# Patient Record
Sex: Male | Born: 1985 | Race: White | Hispanic: No | Marital: Married | Smoking: Never smoker
Health system: Southern US, Community
[De-identification: ages and names within clinical notes are randomized; demographics above are authoritative.]

---
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
## 2012-09-13 ENCOUNTER — Emergency Department (HOSPITAL_COMMUNITY)

## 2012-09-13 ENCOUNTER — Emergency Department (HOSPITAL_COMMUNITY)
Admission: EM | Admit: 2012-09-13 | Discharge: 2012-09-13 | Disposition: A | Discharge status: Home / Self Care | Attending: Emergency Medicine | Admitting: Emergency Medicine

## 2012-09-13 ENCOUNTER — Encounter (HOSPITAL_COMMUNITY): Payer: Self-pay | Admitting: *Deleted

## 2012-09-13 DIAGNOSIS — Y929 Unspecified place or not applicable: Secondary | ICD-10-CM | POA: Insufficient documentation

## 2012-09-13 DIAGNOSIS — S0003XA Contusion of scalp, initial encounter: Secondary | ICD-10-CM | POA: Insufficient documentation

## 2012-09-13 DIAGNOSIS — S0093XA Contusion of unspecified part of head, initial encounter: Secondary | ICD-10-CM

## 2012-09-13 DIAGNOSIS — W010XXA Fall on same level from slipping, tripping and stumbling without subsequent striking against object, initial encounter: Secondary | ICD-10-CM | POA: Insufficient documentation

## 2012-09-13 DIAGNOSIS — Y9389 Activity, other specified: Secondary | ICD-10-CM | POA: Insufficient documentation

## 2012-09-13 DIAGNOSIS — W19XXXA Unspecified fall, initial encounter: Secondary | ICD-10-CM

## 2012-09-13 DIAGNOSIS — S0100XA Unspecified open wound of scalp, initial encounter: Secondary | ICD-10-CM | POA: Insufficient documentation

## 2012-09-13 DIAGNOSIS — S0101XA Laceration without foreign body of scalp, initial encounter: Secondary | ICD-10-CM

## 2012-09-13 DIAGNOSIS — S300XXA Contusion of lower back and pelvis, initial encounter: Secondary | ICD-10-CM

## 2012-09-13 MED ORDER — IBUPROFEN 600 MG PO TABS: 600.0000 mg | ORAL_TABLET | Freq: Four times a day (QID) | ORAL | Status: DC | PRN

## 2012-09-13 MED ORDER — BACITRACIN ZINC 500 UNIT/GM EX OINT
TOPICAL_OINTMENT | CUTANEOUS | Status: AC
  Administered 2012-09-13: 1
  Filled 2012-09-13: qty 0.9

## 2012-09-13 MED ORDER — HYDROCODONE-ACETAMINOPHEN 5-325 MG PO TABS: 1.0000 | ORAL_TABLET | ORAL | Status: DC | PRN

## 2012-09-13 NOTE — ED Notes (Signed)
Pt fell off the back of a truck, has abrasion ,with swelling to back of scalp, pain "tail bone" and abrasion to his rt elbow.  ?LOC, No neck pain,  Alert, talking, ambulatory into tx room.

## 2012-09-13 NOTE — ED Provider Notes (Signed)
CSN: [id number]     Arrival date & time 09/13/12  1147 History   First MD Initiated Contact with Patient 09/13/12 1159     Chief Complaint  Patient presents with  . Fall   (Consider location/radiation/quality/duration/timing/severity/associated sxs/prior Treatment) Patient is a 27 y.o. male presenting with fall. The history is provided by the patient.  Fall This is a new problem. The current episode started today. The problem has been gradually improving. Associated symptoms include headaches. Pertinent negatives include no abdominal pain, chest pain, diaphoresis, fever, nausea, neck pain, numbness, visual change or vomiting. Associated symptoms comments: Laceration scalp.   Alvin Jordan is a 27 y.o. male who presents to the ED with low back pain and headache after slipping and falling off the back of a truck. He was removing a tarp when he fell. He hit his lower back at his tail bone and back of his head. Reports headache with lightheadedness. ? LOC. No one witnessed the fall. The history was provided by the patient.  History reviewed. No pertinent past medical history. History reviewed. No pertinent past surgical history. No family history on file. History  Substance Use Topics  . Smoking status: Not on file  . Smokeless tobacco: Current User    Types: Chew  . Alcohol Use: Yes     Comment: occasional    Review of Systems  Constitutional: Negative for fever and diaphoresis.  HENT: Negative for neck pain.   Eyes: Negative for visual disturbance.  Cardiovascular: Negative for chest pain.  Gastrointestinal: Negative for nausea, vomiting and abdominal pain.  Musculoskeletal: Positive for back pain.  Skin: Positive for wound.  Neurological: Positive for light-headedness and headaches. Negative for numbness.  Psychiatric/Behavioral: Negative for confusion. The patient is not nervous/anxious.     Allergies  Review of patient's allergies indicates no known allergies.  Home Medications   No current outpatient prescriptions on file. BP 126/84  Pulse 83  Temp(Src) 98.1 F (36.7 C) (Oral)  Resp 20  Ht 5\' 3"  (1.6 m)  Wt 173 lb (78.472 kg)  BMI 30.65 kg/m2  SpO2 98% Physical Exam  Nursing note and vitals reviewed. Constitutional: He is oriented to person, place, and time. He appears well-developed and well-nourished. No distress.  HENT:  Head:    Right Ear: Tympanic membrane normal.  Left Ear: Tympanic membrane normal.  Nose: Nose normal.  Mouth/Throat: Uvula is midline.  Superficial scalp laceration.  Eyes: Conjunctivae and EOM are normal. Pupils are equal, round, and reactive to light.  Neck: Normal range of motion. Neck supple.  Cardiovascular: Normal rate and regular rhythm.   Pulmonary/Chest: Effort normal and breath sounds normal.  Abdominal: Soft. Bowel sounds are normal. There is no tenderness.  Musculoskeletal: Normal range of motion.       Back:  Tenderness with palpation coccyx area  Neurological: He is alert and oriented to person, place, and time. No cranial nerve deficit.  Skin: Skin is warm and dry.  Psychiatric: He has a normal mood and affect. His behavior is normal. Judgment and thought content normal.     Dg Sacrum/coccyx  09/13/2012   CLINICAL DATA:  Fall, pain.  EXAM: SACRUM AND COCCYX - 2+ VIEW  COMPARISON:  None.  FINDINGS: There is no evidence of fracture or other focal bone lesions  IMPRESSION: Negative.   Electronically Signed   By: Charlett Nose   On: 09/13/2012 13:26   Ct Head Wo Contrast  09/13/2012   CLINICAL DATA:  Fall, hit head.  EXAM: CT HEAD WITHOUT CONTRAST  TECHNIQUE: Contiguous axial images were obtained from the base of the skull through the vertex without intravenous contrast.  COMPARISON:  None.  FINDINGS: No acute intracranial abnormality. Specifically, no hemorrhage, hydrocephalus, mass lesion, acute infarction, or significant intracranial injury. No acute calvarial abnormality.  Slight mucosal thickening in the  paranasal sinuses. No air-fluid levels.  IMPRESSION: No acute intracranial abnormality.   Electronically Signed   By: Charlett Nose   On: 09/13/2012 13:43    ED Course  Procedures  MDM  27 y.o. male with contusion and laceration of posterior scalp area s/p fall from a truck. Contusion to coccyx area.  I have reviewed this patient's vital signs, nurses notes,  and imaging.  I have discussed findings with the patient and his coworker and plan of care. They voice understanding. Instructions given on head injury observation.    Medication List         HYDROcodone-acetaminophen 5-325 MG per tablet  Commonly known as:  NORCO/VICODIN  Take 1 tablet by mouth every 4 (four) hours as needed.     ibuprofen 600 MG tablet  Commonly known as:  ADVIL,MOTRIN  Take 1 tablet (600 mg total) by mouth every 6 (six) hours as needed for pain.           [facility] Orlene Och, [place] 09/13/12 [phone]

## 2012-09-13 NOTE — ED Notes (Signed)
Abrasion to post scalp cleansed  And bacitracin applied.

## 2012-09-13 NOTE — ED Notes (Signed)
Climbing off back of truck, slipped, landing on back.  Reports hit head, but denies LOC.  Reports headache with lightheadedness, and sacral pain.

## 2012-09-15 NOTE — ED Provider Notes (Signed)
Medical screening examination/treatment/procedure(s) were performed by non-physician practitioner and as supervising physician I was immediately available for consultation/collaboration.  Donnetta Hutching, MD 09/15/12 1623

## 2013-11-16 ENCOUNTER — Observation Stay (HOSPITAL_COMMUNITY): Admitting: Certified Registered Nurse Anesthetist

## 2013-11-16 ENCOUNTER — Encounter (HOSPITAL_COMMUNITY): Payer: Self-pay | Admitting: Emergency Medicine

## 2013-11-16 ENCOUNTER — Emergency Department (HOSPITAL_COMMUNITY)

## 2013-11-16 ENCOUNTER — Encounter (HOSPITAL_COMMUNITY)
Admission: EM | Disposition: A | Discharge status: Home / Self Care | Payer: Self-pay | Source: Home / Self Care | Attending: Emergency Medicine

## 2013-11-16 ENCOUNTER — Observation Stay (HOSPITAL_COMMUNITY)
Admission: EM | Admit: 2013-11-16 | Discharge: 2013-11-17 | Disposition: A | Discharge status: Home / Self Care | Attending: Surgery | Admitting: Surgery

## 2013-11-16 ENCOUNTER — Encounter (HOSPITAL_COMMUNITY): Admitting: Certified Registered Nurse Anesthetist

## 2013-11-16 DIAGNOSIS — K353 Acute appendicitis with localized peritonitis, without perforation or gangrene: Secondary | ICD-10-CM

## 2013-11-16 DIAGNOSIS — Z720 Tobacco use: Secondary | ICD-10-CM | POA: Diagnosis not present

## 2013-11-16 DIAGNOSIS — Z6832 Body mass index (BMI) 32.0-32.9, adult: Secondary | ICD-10-CM | POA: Diagnosis not present

## 2013-11-16 DIAGNOSIS — R1031 Right lower quadrant pain: Secondary | ICD-10-CM

## 2013-11-16 DIAGNOSIS — K3580 Unspecified acute appendicitis: Principal | ICD-10-CM | POA: Insufficient documentation

## 2013-11-16 HISTORY — PX: LAPAROSCOPIC APPENDECTOMY: SHX408

## 2013-11-16 LAB — CBC WITH DIFFERENTIAL/PLATELET
Basophils Absolute: 0 10*3/uL (ref 0.0–0.1)
Basophils Relative: 0 % (ref 0–1)
Eosinophils Absolute: 0 10*3/uL (ref 0.0–0.7)
Eosinophils Relative: 0 % (ref 0–5)
HEMATOCRIT: 45.3 % (ref 39.0–52.0)
HEMOGLOBIN: 16.1 g/dL (ref 13.0–17.0)
LYMPHS ABS: 2.2 10*3/uL (ref 0.7–4.0)
LYMPHS PCT: 11 % — AB (ref 12–46)
MCH: 32.7 pg (ref 26.0–34.0)
MCHC: 35.5 g/dL (ref 30.0–36.0)
MCV: 92.1 fL (ref 78.0–100.0)
MONO ABS: 1.4 10*3/uL — AB (ref 0.1–1.0)
MONOS PCT: 7 % (ref 3–12)
NEUTROS ABS: 16.4 10*3/uL — AB (ref 1.7–7.7)
NEUTROS PCT: 82 % — AB (ref 43–77)
Platelets: 213 10*3/uL (ref 150–400)
RBC: 4.92 MIL/uL (ref 4.22–5.81)
RDW: 12.3 % (ref 11.5–15.5)
WBC: 20 10*3/uL — AB (ref 4.0–10.5)

## 2013-11-16 LAB — URINALYSIS, ROUTINE W REFLEX MICROSCOPIC
BILIRUBIN URINE: NEGATIVE
GLUCOSE, UA: NEGATIVE mg/dL
Hgb urine dipstick: NEGATIVE
KETONES UR: NEGATIVE mg/dL
Leukocytes, UA: NEGATIVE
Nitrite: NEGATIVE
Protein, ur: NEGATIVE mg/dL
Specific Gravity, Urine: 1.004 — ABNORMAL LOW (ref 1.005–1.030)
UROBILINOGEN UA: 0.2 mg/dL (ref 0.0–1.0)
pH: 6.5 (ref 5.0–8.0)

## 2013-11-16 LAB — COMPREHENSIVE METABOLIC PANEL
ALT: 22 U/L (ref 0–53)
ANION GAP: 13 (ref 5–15)
AST: 20 U/L (ref 0–37)
Albumin: 4.4 g/dL (ref 3.5–5.2)
Alkaline Phosphatase: 71 U/L (ref 39–117)
BILIRUBIN TOTAL: 2.2 mg/dL — AB (ref 0.3–1.2)
BUN: 8 mg/dL (ref 6–23)
CO2: 26 meq/L (ref 19–32)
CREATININE: 0.94 mg/dL (ref 0.50–1.35)
Calcium: 9.8 mg/dL (ref 8.4–10.5)
Chloride: 101 mEq/L (ref 96–112)
GFR calc Af Amer: >90 mL/min (ref >90)
Glucose, Bld: 98 mg/dL (ref 70–99)
Potassium: 3.8 mEq/L (ref 3.7–5.3)
Sodium: 140 mEq/L (ref 137–147)
Total Protein: 8 g/dL (ref 6.0–8.3)

## 2013-11-16 LAB — LIPASE, BLOOD: Lipase: 15 U/L (ref 11–59)

## 2013-11-16 LAB — SURGICAL PCR SCREEN
MRSA, PCR: NEGATIVE
Staphylococcus aureus: NEGATIVE

## 2013-11-16 SURGERY — APPENDECTOMY, LAPAROSCOPIC
Anesthesia: General | Site: Abdomen

## 2013-11-16 MED ORDER — MIDAZOLAM HCL 2 MG/2ML IJ SOLN: 0.5000 mg | Freq: Once | INTRAMUSCULAR | Status: AC | PRN

## 2013-11-16 MED ORDER — ONDANSETRON HCL 4 MG/2ML IJ SOLN
INTRAMUSCULAR | Status: AC
  Filled 2013-11-16: qty 2

## 2013-11-16 MED ORDER — OXYCODONE HCL 5 MG/5ML PO SOLN
5.0000 mg | Freq: Once | ORAL | Status: AC | PRN
Start: 2013-11-16 — End: 2013-11-16

## 2013-11-16 MED ORDER — 0.9 % SODIUM CHLORIDE (POUR BTL) OPTIME
TOPICAL | Status: DC | PRN
  Administered 2013-11-16: 1000 mL

## 2013-11-16 MED ORDER — KCL IN DEXTROSE-NACL 20-5-0.45 MEQ/L-%-% IV SOLN
INTRAVENOUS | Status: DC
  Administered 2013-11-16 – 2013-11-17 (×2): via INTRAVENOUS
  Filled 2013-11-16 (×5): qty 1000

## 2013-11-16 MED ORDER — DIPHENHYDRAMINE HCL 50 MG/ML IJ SOLN: 12.5000 mg | Freq: Four times a day (QID) | INTRAMUSCULAR | Status: DC | PRN

## 2013-11-16 MED ORDER — BUPIVACAINE-EPINEPHRINE 0.25% -1:200000 IJ SOLN
INTRAMUSCULAR | Status: DC | PRN
  Administered 2013-11-16: 8 mL

## 2013-11-16 MED ORDER — LIDOCAINE HCL (CARDIAC) 20 MG/ML IV SOLN
INTRAVENOUS | Status: AC
  Filled 2013-11-16: qty 5

## 2013-11-16 MED ORDER — IOHEXOL 300 MG/ML  SOLN
25.0000 mL | INTRAMUSCULAR | Status: AC
  Administered 2013-11-16: 25 mL via ORAL

## 2013-11-16 MED ORDER — NEOSTIGMINE METHYLSULFATE 10 MG/10ML IV SOLN
INTRAVENOUS | Status: AC
  Filled 2013-11-16: qty 2

## 2013-11-16 MED ORDER — SODIUM CHLORIDE 0.9 % IV BOLUS (SEPSIS)
1000.0000 mL | Freq: Once | INTRAVENOUS | Status: AC
Start: 2013-11-16 — End: 2013-11-16
  Administered 2013-11-16: 1000 mL via INTRAVENOUS

## 2013-11-16 MED ORDER — LACTATED RINGERS IV SOLN
INTRAVENOUS | Status: DC | PRN
  Administered 2013-11-16: 19:00:00 via INTRAVENOUS

## 2013-11-16 MED ORDER — OXYCODONE-ACETAMINOPHEN 5-325 MG PO TABS
1.0000 | ORAL_TABLET | ORAL | Status: DC | PRN
  Administered 2013-11-17: 1 via ORAL
  Filled 2013-11-16 (×2): qty 1

## 2013-11-16 MED ORDER — ONDANSETRON HCL 4 MG/2ML IJ SOLN
4.0000 mg | Freq: Once | INTRAMUSCULAR | Status: AC
  Administered 2013-11-16: 4 mg via INTRAVENOUS
  Filled 2013-11-16: qty 2

## 2013-11-16 MED ORDER — METRONIDAZOLE IN NACL 5-0.79 MG/ML-% IV SOLN
500.0000 mg | Freq: Three times a day (TID) | INTRAVENOUS | Status: DC
  Administered 2013-11-16 – 2013-11-17 (×3): 500 mg via INTRAVENOUS
  Filled 2013-11-16 (×4): qty 100

## 2013-11-16 MED ORDER — GLYCOPYRROLATE 0.2 MG/ML IJ SOLN
INTRAMUSCULAR | Status: AC
  Filled 2013-11-16: qty 3

## 2013-11-16 MED ORDER — DEXTROSE 5 % IV SOLN
2.0000 g | INTRAVENOUS | Status: DC
  Administered 2013-11-16: 2 g via INTRAVENOUS
  Filled 2013-11-16 (×2): qty 2

## 2013-11-16 MED ORDER — MEPERIDINE HCL 25 MG/ML IJ SOLN
6.2500 mg | INTRAMUSCULAR | Status: DC | PRN
  Filled 2013-11-16: qty 1

## 2013-11-16 MED ORDER — DIPHENHYDRAMINE HCL 50 MG/ML IJ SOLN
INTRAMUSCULAR | Status: AC
  Filled 2013-11-16: qty 1

## 2013-11-16 MED ORDER — SUCCINYLCHOLINE CHLORIDE 20 MG/ML IJ SOLN
INTRAMUSCULAR | Status: AC
  Filled 2013-11-16: qty 1

## 2013-11-16 MED ORDER — FENTANYL CITRATE 0.05 MG/ML IJ SOLN
INTRAMUSCULAR | Status: AC
  Filled 2013-11-16: qty 5

## 2013-11-16 MED ORDER — SODIUM CHLORIDE 0.9 % IR SOLN
Status: DC | PRN
  Administered 2013-11-16: 1000 mL

## 2013-11-16 MED ORDER — ONDANSETRON HCL 4 MG/2ML IJ SOLN
INTRAMUSCULAR | Status: DC | PRN
  Administered 2013-11-16: 4 mg via INTRAVENOUS

## 2013-11-16 MED ORDER — ACETAMINOPHEN 325 MG PO TABS: 650.0000 mg | ORAL_TABLET | Freq: Four times a day (QID) | ORAL | Status: DC | PRN

## 2013-11-16 MED ORDER — HYDROMORPHONE HCL 1 MG/ML IJ SOLN
0.2500 mg | INTRAMUSCULAR | Status: DC | PRN
  Administered 2013-11-16: 0.5 mg via INTRAVENOUS

## 2013-11-16 MED ORDER — GLYCOPYRROLATE 0.2 MG/ML IJ SOLN
INTRAMUSCULAR | Status: DC | PRN
  Administered 2013-11-16: 0.6 mg via INTRAVENOUS

## 2013-11-16 MED ORDER — DEXAMETHASONE SODIUM PHOSPHATE 4 MG/ML IJ SOLN
INTRAMUSCULAR | Status: DC | PRN
  Administered 2013-11-16: 8 mg via INTRAVENOUS

## 2013-11-16 MED ORDER — SUCCINYLCHOLINE CHLORIDE 20 MG/ML IJ SOLN
INTRAMUSCULAR | Status: DC | PRN
  Administered 2013-11-16: 120 mg via INTRAVENOUS

## 2013-11-16 MED ORDER — ROCURONIUM BROMIDE 100 MG/10ML IV SOLN
INTRAVENOUS | Status: DC | PRN
  Administered 2013-11-16: 25 mg via INTRAVENOUS

## 2013-11-16 MED ORDER — FENTANYL CITRATE 0.05 MG/ML IJ SOLN
INTRAMUSCULAR | Status: DC | PRN
  Administered 2013-11-16 (×5): 50 ug via INTRAVENOUS

## 2013-11-16 MED ORDER — MIDAZOLAM HCL 5 MG/5ML IJ SOLN
INTRAMUSCULAR | Status: DC | PRN
  Administered 2013-11-16: 2 mg via INTRAVENOUS

## 2013-11-16 MED ORDER — DIPHENHYDRAMINE HCL 50 MG/ML IJ SOLN: 10.0000 mg | Freq: Once | INTRAMUSCULAR | Status: DC

## 2013-11-16 MED ORDER — HYDROMORPHONE HCL 1 MG/ML IJ SOLN
INTRAMUSCULAR | Status: AC
  Filled 2013-11-16: qty 1

## 2013-11-16 MED ORDER — OXYCODONE HCL 5 MG PO TABS: 5.0000 mg | ORAL_TABLET | Freq: Once | ORAL | Status: AC | PRN

## 2013-11-16 MED ORDER — ACETAMINOPHEN 650 MG RE SUPP: 650.0000 mg | Freq: Four times a day (QID) | RECTAL | Status: DC | PRN

## 2013-11-16 MED ORDER — KCL IN DEXTROSE-NACL 20-5-0.45 MEQ/L-%-% IV SOLN
INTRAVENOUS | Status: AC
  Filled 2013-11-16: qty 1000

## 2013-11-16 MED ORDER — DIPHENHYDRAMINE HCL 50 MG/ML IJ SOLN
INTRAMUSCULAR | Status: DC | PRN
  Administered 2013-11-16: 12.5 mg via INTRAVENOUS

## 2013-11-16 MED ORDER — MORPHINE SULFATE 2 MG/ML IJ SOLN
1.0000 mg | INTRAMUSCULAR | Status: DC | PRN
  Administered 2013-11-17 (×2): 2 mg via INTRAVENOUS
  Filled 2013-11-16 (×2): qty 1

## 2013-11-16 MED ORDER — ONDANSETRON HCL 4 MG/2ML IJ SOLN: 4.0000 mg | Freq: Four times a day (QID) | INTRAMUSCULAR | Status: DC | PRN

## 2013-11-16 MED ORDER — PROMETHAZINE HCL 25 MG/ML IJ SOLN: 6.2500 mg | INTRAMUSCULAR | Status: DC | PRN

## 2013-11-16 MED ORDER — PROPOFOL 10 MG/ML IV BOLUS
INTRAVENOUS | Status: DC | PRN
  Administered 2013-11-16: 200 mg via INTRAVENOUS

## 2013-11-16 MED ORDER — BUPIVACAINE-EPINEPHRINE (PF) 0.25% -1:200000 IJ SOLN
INTRAMUSCULAR | Status: AC
  Filled 2013-11-16: qty 30

## 2013-11-16 MED ORDER — DIPHENHYDRAMINE HCL 12.5 MG/5ML PO ELIX
12.5000 mg | ORAL_SOLUTION | Freq: Four times a day (QID) | ORAL | Status: DC | PRN
Start: 2013-11-16 — End: 2013-11-17

## 2013-11-16 MED ORDER — MORPHINE SULFATE 4 MG/ML IJ SOLN
4.0000 mg | Freq: Once | INTRAMUSCULAR | Status: AC
  Administered 2013-11-16: 4 mg via INTRAVENOUS
  Filled 2013-11-16: qty 1

## 2013-11-16 MED ORDER — NEOSTIGMINE METHYLSULFATE 10 MG/10ML IV SOLN
INTRAVENOUS | Status: DC | PRN
  Administered 2013-11-16: 5 mg via INTRAVENOUS

## 2013-11-16 MED ORDER — KETOROLAC TROMETHAMINE 30 MG/ML IJ SOLN
INTRAMUSCULAR | Status: AC
  Filled 2013-11-16: qty 1

## 2013-11-16 MED ORDER — MIDAZOLAM HCL 2 MG/2ML IJ SOLN
INTRAMUSCULAR | Status: AC
  Filled 2013-11-16: qty 2

## 2013-11-16 MED ORDER — OXYCODONE HCL 5 MG PO TABS
ORAL_TABLET | ORAL | Status: AC
  Filled 2013-11-16: qty 1

## 2013-11-16 MED ORDER — IOHEXOL 300 MG/ML  SOLN
80.0000 mL | Freq: Once | INTRAMUSCULAR | Status: AC | PRN
  Administered 2013-11-16: 80 mL via INTRAVENOUS

## 2013-11-16 MED ORDER — ROCURONIUM BROMIDE 50 MG/5ML IV SOLN
INTRAVENOUS | Status: AC
  Filled 2013-11-16: qty 2

## 2013-11-16 MED ORDER — PROPOFOL 10 MG/ML IV BOLUS
INTRAVENOUS | Status: AC
  Filled 2013-11-16: qty 20

## 2013-11-16 MED ORDER — LACTATED RINGERS IV SOLN
INTRAVENOUS | Status: DC
  Administered 2013-11-16: 18:00:00 via INTRAVENOUS

## 2013-11-16 MED ORDER — KETOROLAC TROMETHAMINE 30 MG/ML IJ SOLN
30.0000 mg | Freq: Four times a day (QID) | INTRAMUSCULAR | Status: DC
  Administered 2013-11-16 – 2013-11-17 (×3): 30 mg via INTRAVENOUS
  Filled 2013-11-16 (×6): qty 1

## 2013-11-16 SURGICAL SUPPLY — 53 items
ADH SKN CLS LQ APL DERMABOND (GAUZE/BANDAGES/DRESSINGS) ×1
APL SKNCLS STERI-STRIP NONHPOA (GAUZE/BANDAGES/DRESSINGS) ×1
APPLIER CLIP ROT 10 11.4 M/L (STAPLE) ×3
APR CLP MED LRG 11.4X10 (STAPLE) ×1
BAG SPEC RTRVL LRG 6X4 10 (ENDOMECHANICALS) ×1
BENZOIN TINCTURE PRP APPL 2/3 (GAUZE/BANDAGES/DRESSINGS) ×3 IMPLANT
BLADE SURG ROTATE 9660 (MISCELLANEOUS) IMPLANT
CANISTER SUCTION 2500CC (MISCELLANEOUS) ×3 IMPLANT
CHLORAPREP W/TINT 26ML (MISCELLANEOUS) ×3 IMPLANT
CLIP APPLIE ROT 10 11.4 M/L (STAPLE) IMPLANT
CLOSURE STERI-STRIP 1/2X4 (GAUZE/BANDAGES/DRESSINGS) ×1
CLSR STERI-STRIP ANTIMIC 1/2X4 (GAUZE/BANDAGES/DRESSINGS) ×1 IMPLANT
COVER SURGICAL LIGHT HANDLE (MISCELLANEOUS) ×3 IMPLANT
CUTTER FLEX LINEAR 45M (STAPLE) ×3 IMPLANT
DERMABOND ADHESIVE PROPEN (GAUZE/BANDAGES/DRESSINGS) ×2
DERMABOND ADVANCED .7 DNX6 (GAUZE/BANDAGES/DRESSINGS) IMPLANT
DRAPE LAPAROSCOPIC ABDOMINAL (DRAPES) ×3 IMPLANT
DRAPE UTILITY 15X26 W/TAPE STR (DRAPE) ×6 IMPLANT
DRSG TEGADERM 2-3/8X2-3/4 SM (GAUZE/BANDAGES/DRESSINGS) ×6 IMPLANT
DRSG TEGADERM 4X4.75 (GAUZE/BANDAGES/DRESSINGS) ×7 IMPLANT
ELECT REM PT RETURN 9FT ADLT (ELECTROSURGICAL) ×3
ELECTRODE REM PT RTRN 9FT ADLT (ELECTROSURGICAL) ×1 IMPLANT
ENDOLOOP SUT PDS II  0 18 (SUTURE)
ENDOLOOP SUT PDS II 0 18 (SUTURE) IMPLANT
FILTER SMOKE EVAC LAPAROSHD (FILTER) ×3 IMPLANT
GAUZE SPONGE 2X2 8PLY STRL LF (GAUZE/BANDAGES/DRESSINGS) ×1 IMPLANT
GLOVE BIO SURGEON STRL SZ7 (GLOVE) ×3 IMPLANT
GLOVE BIOGEL PI IND STRL 7.5 (GLOVE) ×1 IMPLANT
GLOVE BIOGEL PI INDICATOR 7.5 (GLOVE) ×2
GLOVE BIOGEL PI ORTHO PRO SZ7 (GLOVE) ×2
GLOVE ECLIPSE 6.5 STRL STRAW (GLOVE) ×2 IMPLANT
GLOVE PI ORTHO PRO STRL SZ7 (GLOVE) IMPLANT
GLOVE SURG SS PI 6.0 STRL IVOR (GLOVE) ×2 IMPLANT
GOWN STRL REUS W/ TWL LRG LVL3 (GOWN DISPOSABLE) ×3 IMPLANT
GOWN STRL REUS W/TWL LRG LVL3 (GOWN DISPOSABLE) ×9
KIT BASIN OR (CUSTOM PROCEDURE TRAY) ×3 IMPLANT
KIT ROOM TURNOVER OR (KITS) ×3 IMPLANT
NS IRRIG 1000ML POUR BTL (IV SOLUTION) ×3 IMPLANT
PAD ARMBOARD 7.5X6 YLW CONV (MISCELLANEOUS) ×6 IMPLANT
POUCH SPECIMEN RETRIEVAL 10MM (ENDOMECHANICALS) ×3 IMPLANT
RELOAD STAPLE 45 3.5 BLU ETS (ENDOMECHANICALS) ×1 IMPLANT
RELOAD STAPLE TA45 3.5 REG BLU (ENDOMECHANICALS) ×3 IMPLANT
SCALPEL HARMONIC ACE (MISCELLANEOUS) ×3 IMPLANT
SET IRRIG TUBING LAPAROSCOPIC (IRRIGATION / IRRIGATOR) ×3 IMPLANT
SPECIMEN JAR SMALL (MISCELLANEOUS) ×3 IMPLANT
SPONGE GAUZE 2X2 STER 10/PKG (GAUZE/BANDAGES/DRESSINGS) ×2
SUT MNCRL AB 4-0 PS2 18 (SUTURE) ×3 IMPLANT
TOWEL OR 17X24 6PK STRL BLUE (TOWEL DISPOSABLE) ×3 IMPLANT
TOWEL OR 17X26 10 PK STRL BLUE (TOWEL DISPOSABLE) ×3 IMPLANT
TRAY LAPAROSCOPIC (CUSTOM PROCEDURE TRAY) ×3 IMPLANT
TROCAR XCEL BLADELESS 5X75MML (TROCAR) ×6 IMPLANT
TROCAR XCEL BLUNT TIP 100MML (ENDOMECHANICALS) ×3 IMPLANT
TUBING INSUFFLATION (TUBING) ×3 IMPLANT

## 2013-11-16 NOTE — Op Note (Signed)
Appendectomy, Lap, Procedure Note  Indications: The patient presented with a history of right-sided abdominal pain. A CT scan revealed findings consistent with acute appendicitis.  Pre-operative Diagnosis: Acute appendicitis without mention of peritonitis  Post-operative Diagnosis: Same  Surgeon: Maximos Zayas K.   Assistants: none  Anesthesia: General endotracheal anesthesia  ASA Class: 1E  Procedure Details  The patient was seen again in the Holding Room. The risks, benefits, complications, treatment options, and expected outcomes were discussed with the patient and/or family. The possibilities of reaction to medication, perforation of viscus, bleeding, recurrent infection, finding a normal appendix, the need for additional procedures, failure to diagnose a condition, and creating a complication requiring transfusion or operation were discussed. There was concurrence with the proposed plan and informed consent was obtained. The site of surgery was properly noted. The patient was taken to Operating Room, identified as Alvin Jordan and the procedure verified as Appendectomy. A Time Out was held and the above information confirmed.  The patient was placed in the supine position and general anesthesia was induced.  The abdomen was prepped and draped in a sterile fashion. A one centimeter supraumbilical incision was made.  Dissection was carried down to the fascia bluntly.  The fascia was incised vertically.  We entered the peritoneal cavity bluntly.  A pursestring suture was passed around the incision with a 0 Vicryl.  The Hasson cannula was introduced into the abdomen and the tails of the suture were used to hold the Hasson in place.   The pneumoperitoneum was then established maintaining a maximum pressure of 15 mmHg.  Additional 5 mm cannulas then placed in the left lower quadrant of the abdomen and the right upper quadrant under direct visualization. A careful evaluation of the entire abdomen  was carried out. The patient was placed in Trendelenburg and left lateral decubitus position.  The scope was moved to the right upper quadrant port site. The cecum was mobilized medially.  The appendix was identified and was inflamed, but not perforated.  The appendix was carefully dissected. The appendix was was skeletonized with the harmonic scalpel.   The appendix was divided at its base using an endo-GIA stapler. Minimal appendiceal stump was left in place. There was no evidence of bleeding, leakage, or complication after division of the appendix. Irrigation was also performed and irrigate suctioned from the abdomen as well.  The umbilical port site was closed with the purse string suture. There was no residual palpable fascial defect.  The trocar site skin wounds were closed with 4-0 Monocryl.  Instrument, sponge, and needle counts were correct at the conclusion of the case.   Findings: The appendix was found to be inflamed. There were not signs of necrosis.  There was not perforation. There was not abscess formation.  Estimated Blood Loss:  Minimal         Drains: none         Specimens: appendix         Complications:  None; patient tolerated the procedure well.         Disposition: PACU - hemodynamically stable.         Condition: stable  Alvin ArmsMatthew K. Corliss Skainssuei, MD, [facility]  General/ Trauma Surgery  11/16/2013 7:29 PM

## 2013-11-16 NOTE — ED Notes (Signed)
Attempted report 

## 2013-11-16 NOTE — Anesthesia Postprocedure Evaluation (Signed)
  Anesthesia Post-op Note  Patient: Alvin Jordan  Procedure(s) Performed: Procedure(s): APPENDECTOMY LAPAROSCOPIC (N/A)  Patient Location: PACU  Anesthesia Type:General  Level of Consciousness: oriented, sedated, patient cooperative and responds to stimulation and voice  Airway and Oxygen Therapy: Patient Spontanous Breathing  Post-op Pain: none  Post-op Assessment: Post-op Vital signs reviewed, Patient's Cardiovascular Status Stable, Respiratory Function Stable, Patent Airway, No signs of Nausea or vomiting and Pain level controlled  Post-op Vital Signs: Reviewed and stable  Last Vitals:  Filed Vitals:   11/16/13 2000  BP: 114/71  Pulse: 72  Temp:   Resp: 17    Complications: No apparent anesthesia complications

## 2013-11-16 NOTE — H&P (Signed)
Agree with above.  Will proceed with laparoscopic appendectomy.  The surgical procedure has been discussed with the patient.  Potential risks, benefits, alternative treatments, and expected outcomes have been explained.  All of the patient's questions at this time have been answered.  The likelihood of reaching the patient's treatment goal is good.  The patient understand the proposed surgical procedure and wishes to proceed.  Alvin ArmsMatthew K. Corliss Skainssuei, MD, [facility]  General/ Trauma Surgery  11/16/2013 6:28 PM

## 2013-11-16 NOTE — ED Notes (Signed)
Junious SilkHannah Merrell at bedside with patient explaining CT results

## 2013-11-16 NOTE — ED Provider Notes (Signed)
CSN: [id number]     Arrival date & time 11/16/13  1259 History   First MD Initiated Contact with Patient 11/16/13 1316     Chief Complaint  Patient presents with  . Abdominal Pain     (Consider location/radiation/quality/duration/timing/severity/associated sxs/prior Treatment) HPI Comments: Patient is a [age] are old male who presents emergency Department today for evaluation of abdominal pain. He reports that yesterday the abdominal pain began and was in his lower quadrants. He woke up this morning and the pain had moved into his right lower quadrant. He has associated nausea and vomiting. He has been constipated for the past 4 days which is what he attributed his abdominal pain to. He denies fevers. He has never had a prior abdominal surgery. He went to urgent care for a work note and was sent here to rule out appendicitis.  Patient is a 28 y.o. male presenting with abdominal pain. The history is provided by the patient. No language interpreter was used.  Abdominal Pain Associated symptoms: nausea and vomiting   Associated symptoms: no chest pain, no chills, no fever and no shortness of breath     History reviewed. No pertinent past medical history. History reviewed. No pertinent past surgical history. History reviewed. No pertinent family history. History  Substance Use Topics  . Smoking status: Never Smoker   . Smokeless tobacco: Current User    Types: Chew  . Alcohol Use: Yes     Comment: occasional    Review of Systems  Constitutional: Negative for fever and chills.  Respiratory: Negative for shortness of breath.   Cardiovascular: Negative for chest pain.  Gastrointestinal: Positive for nausea, vomiting and abdominal pain.  All other systems reviewed and are negative.     Allergies  Review of patient's allergies indicates no known allergies.  Home Medications   Prior to Admission medications   Medication Sig Start Date End Date Taking? Authorizing Provider   polyethylene glycol (MIRALAX / GLYCOLAX) packet Take 17 g by mouth once.   Yes Historical Provider, MD   BP 108/72  Pulse 83  Temp(Src) 98.7 F (37.1 C) (Oral)  Resp 19  SpO2 96% Physical Exam  Nursing note and vitals reviewed. Constitutional: He is oriented to person, place, and time. He appears well-developed and well-nourished. No distress.  HENT:  Head: Normocephalic and atraumatic.  Right Ear: External ear normal.  Left Ear: External ear normal.  Nose: Nose normal.  Mouth/Throat: Oropharynx is clear and moist.  Eyes: Conjunctivae are normal.  Neck: Normal range of motion. Neck supple. No tracheal deviation present.  Cardiovascular: Normal rate, regular rhythm and normal heart sounds.   Pulmonary/Chest: Effort normal and breath sounds normal. No stridor.  Abdominal: Soft. He exhibits no distension. There is tenderness in the right lower quadrant. There is tenderness at McBurney's point. There is no rigidity, no rebound and no guarding.  Musculoskeletal: Normal range of motion.  Neurological: He is alert and oriented to person, place, and time.  Skin: Skin is warm and dry. He is not diaphoretic.  Psychiatric: He has a normal mood and affect. His behavior is normal.    ED Course  Procedures (including critical care time) Labs Review Labs Reviewed  CBC WITH DIFFERENTIAL - Abnormal; Notable for the following:    WBC 20.0 (*)    Neutrophils Relative % 82 (*)    Neutro Abs 16.4 (*)    Lymphocytes Relative 11 (*)    Monocytes Absolute 1.4 (*)    All other components within  normal limits  COMPREHENSIVE METABOLIC PANEL - Abnormal; Notable for the following:    Total Bilirubin 2.2 (*)    All other components within normal limits  URINALYSIS, ROUTINE W REFLEX MICROSCOPIC - Abnormal; Notable for the following:    Specific Gravity, Urine 1.004 (*)    All other components within normal limits  LIPASE, BLOOD    Imaging Review Ct Abdomen Pelvis W Contrast  11/16/2013    CLINICAL DATA:  Right lower quadrant pain.  EXAM: CT ABDOMEN AND PELVIS WITH CONTRAST  TECHNIQUE: Multidetector CT imaging of the abdomen and pelvis was performed using the standard protocol following bolus administration of intravenous contrast.  CONTRAST:  80mL OMNIPAQUE IOHEXOL 300 MG/ML  SOLN  COMPARISON:  None.  FINDINGS: Liver normal. Spleen normal. Pancreas normal. No biliary distention. The gallbladder is nondistended.  Adrenals normal. Kidneys normal. No hydronephrosis or obstructing ureteral stone. Bladder is nondistended. Prostate is not enlarged. Phleboliths.  No significant adenopathy. Abdominal aorta normal in caliber. Visceral vessels and portal vein patent.  Appendix is swollen to 1.9 cm. Periappendiceal edema noted. These findings are consistent with appendicitis. No evidence of bowel obstruction. No free air. Stomach is nondistended.  Mild basilar atelectasis. Heart size normal. No acute bony abnormality identified.  IMPRESSION: Findings consistent with appendicitis . These results will be called to the ordering clinician or representative by the Radiologist Assistant, and communication documented in the PACS or zVision Dashboard.   Electronically Signed   By: Maisie Fushomas  Register   On: 11/16/2013 14:47     EKG Interpretation None      MDM   Final diagnoses:  RLQ abdominal pain  Acute appendicitis, unspecified acute appendicitis type   Patient is an otherwise healthy 28 year old male who presents to the emergency department with right lower quadrant abdominal pain. Patient with acute appendicitis as seen on CT. There is no perforation or abscess. Surgery to see. Vital signs stable at this time. Pain is controlled. Patient / Family / Caregiver informed of clinical course, understand medical decision-making process, and agree with plan.   Mora BellmanHannah S Arpan Eskelson, PA-C 11/16/13 [phone]

## 2013-11-16 NOTE — H&P (Signed)
Alvin Jordan 11-Nov-1985  [id number].   Chief Complaint/Reason for Consult: abdominal pain HPI: This is a 28 yo white male who began having diffuse lower abdominal pain yesterday afternoon.  Over night he began having multiple episodes of emesis.  This morning his pain had localized to the RLQ.  He presented to [facility] where he was evaluated and found to have acute appendicitis on his CT scan with a WBC of 20K.  We have been asked to see him.  ROS : Please see HPI, otherwise all other systems are negative  History reviewed. No pertinent family history.  History reviewed. No pertinent past medical history.  History reviewed. No pertinent past surgical history.  Social History:  reports that he has never smoked. His smokeless tobacco use includes Chew. He reports that he drinks alcohol. He reports that he does not use illicit drugs.  Allergies: No Known Allergies   (Not in a hospital admission)  Blood pressure 108/72, pulse 83, temperature 98.7 F (37.1 C), temperature source Oral, resp. rate 19, SpO2 96.00%. Physical Exam: General: pleasant, WD, WN white male who is laying in bed in NAD HEENT: head is normocephalic, atraumatic.  Sclera are noninjected.  PERRL.  Ears and nose without any masses or lesions.  Mouth is pink and moist Heart: regular, rate, and rhythm.  Normal s1,s2. No obvious murmurs, gallops, or rubs noted.  Palpable radial and pedal pulses bilaterally Lungs: CTAB, no wheezes, rhonchi, or rales noted.  Respiratory effort nonlabored Abd: soft, tender in RLQ, ND, +BS, no masses, hernias, or organomegaly MS: all 4 extremities are symmetrical with no cyanosis, clubbing, or edema. Skin: warm and dry with no masses, lesions, or rashes Psych: A&Ox3 with an appropriate affect.    Results for orders placed during the hospital encounter of 11/16/13 (from the past 48 hour(s))  URINALYSIS, ROUTINE W REFLEX MICROSCOPIC     Status: Abnormal   Collection Time    11/16/13  1:08 PM   Result Value Ref Range   Color, Urine YELLOW  YELLOW   APPearance CLEAR  CLEAR   Specific Gravity, Urine 1.004 (*) 1.005 - 1.030   pH 6.5  5.0 - 8.0   Glucose, UA NEGATIVE  NEGATIVE mg/dL   Hgb urine dipstick NEGATIVE  NEGATIVE   Bilirubin Urine NEGATIVE  NEGATIVE   Ketones, ur NEGATIVE  NEGATIVE mg/dL   Protein, ur NEGATIVE  NEGATIVE mg/dL   Urobilinogen, UA 0.2  0.0 - 1.0 mg/dL   Nitrite NEGATIVE  NEGATIVE   Leukocytes, UA NEGATIVE  NEGATIVE   Comment: MICROSCOPIC NOT DONE ON URINES WITH NEGATIVE PROTEIN, BLOOD, LEUKOCYTES, NITRITE, OR GLUCOSE <1000 mg/dL.  CBC WITH DIFFERENTIAL     Status: Abnormal   Collection Time    11/16/13  1:15 PM      Result Value Ref Range   WBC 20.0 (*) 4.0 - 10.5 K/uL   RBC 4.92  4.22 - 5.81 MIL/uL   Hemoglobin 16.1  13.0 - 17.0 g/dL   HCT 45.3  39.0 - 52.0 %   MCV 92.1  78.0 - 100.0 fL   MCH 32.7  26.0 - 34.0 pg   MCHC 35.5  30.0 - 36.0 g/dL   RDW 12.3  11.5 - 15.5 %   Platelets 213  150 - 400 K/uL   Neutrophils Relative % 82 (*) 43 - 77 %   Neutro Abs 16.4 (*) 1.7 - 7.7 K/uL   Lymphocytes Relative 11 (*) 12 - 46 %   Lymphs Abs 2.2  0.7 - 4.0 K/uL   Monocytes Relative 7  3 - 12 %   Monocytes Absolute 1.4 (*) 0.1 - 1.0 K/uL   Eosinophils Relative 0  0 - 5 %   Eosinophils Absolute 0.0  0.0 - 0.7 K/uL   Basophils Relative 0  0 - 1 %   Basophils Absolute 0.0  0.0 - 0.1 K/uL  COMPREHENSIVE METABOLIC PANEL     Status: Abnormal   Collection Time    11/16/13  1:15 PM      Result Value Ref Range   Sodium 140  137 - 147 mEq/L   Potassium 3.8  3.7 - 5.3 mEq/L   Chloride 101  96 - 112 mEq/L   CO2 26  19 - 32 mEq/L   Glucose, Bld 98  70 - 99 mg/dL   BUN 8  6 - 23 mg/dL   Creatinine, Ser 0.94  0.50 - 1.35 mg/dL   Calcium 9.8  8.4 - 10.5 mg/dL   Total Protein 8.0  6.0 - 8.3 g/dL   Albumin 4.4  3.5 - 5.2 g/dL   AST 20  0 - 37 U/L   ALT 22  0 - 53 U/L   Alkaline Phosphatase 71  39 - 117 U/L   Total Bilirubin 2.2 (*) 0.3 - 1.2 mg/dL   GFR calc non  Af Amer >90  >90 mL/min   GFR calc Af Amer >90  >90 mL/min   Comment: (NOTE)     The eGFR has been calculated using the CKD EPI equation.     This calculation has not been validated in all clinical situations.     eGFR's persistently <90 mL/min signify possible Chronic Kidney     Disease.   Anion gap 13  5 - 15  LIPASE, BLOOD     Status: None   Collection Time    11/16/13  1:15 PM      Result Value Ref Range   Lipase 15  11 - 59 U/L   Ct Abdomen Pelvis W Contrast  11/16/2013   CLINICAL DATA:  Right lower quadrant pain.  EXAM: CT ABDOMEN AND PELVIS WITH CONTRAST  TECHNIQUE: Multidetector CT imaging of the abdomen and pelvis was performed using the standard protocol following bolus administration of intravenous contrast.  CONTRAST:  42mL OMNIPAQUE IOHEXOL 300 MG/ML  SOLN  COMPARISON:  None.  FINDINGS: Liver normal. Spleen normal. Pancreas normal. No biliary distention. The gallbladder is nondistended.  Adrenals normal. Kidneys normal. No hydronephrosis or obstructing ureteral stone. Bladder is nondistended. Prostate is not enlarged. Phleboliths.  No significant adenopathy. Abdominal aorta normal in caliber. Visceral vessels and portal vein patent.  Appendix is swollen to 1.9 cm. Periappendiceal edema noted. These findings are consistent with appendicitis. No evidence of bowel obstruction. No free air. Stomach is nondistended.  Mild basilar atelectasis. Heart size normal. No acute bony abnormality identified.  IMPRESSION: Findings consistent with appendicitis . These results will be called to the ordering clinician or representative by the Radiologist Assistant, and communication documented in the PACS or zVision Dashboard.   Electronically Signed   By: Marcello Moores  Register   On: 11/16/2013 14:47       Assessment/Plan 1. Acute appendicitis  Plan: 1. Admit and go to OR later tonight for appendectomy.  He has been started on rocephin and flagyl.  The procedure has been explained to the patient and he  is agreeable to proceed.  Mykenna Viele E 11/16/2013, 3:51 PM Pager: [phone]

## 2013-11-16 NOTE — ED Notes (Signed)
Patient to go to surgery in approximately an hour per OR.

## 2013-11-16 NOTE — Transfer of Care (Signed)
Immediate Anesthesia Transfer of Care Note  Patient: Alvin Jordan Alvin Jordan  Procedure(s) Performed: Procedure(s): APPENDECTOMY LAPAROSCOPIC (N/Alvin)  Patient Location: PACU  Anesthesia Type:General  Level of Consciousness: sedated  Airway & Oxygen Therapy: Patient Spontanous Breathing and Patient connected to face mask oxygen  Post-op Assessment: Report given to PACU RN and Post -op Vital signs reviewed and stable  Post vital signs: Reviewed and stable  Complications: No apparent anesthesia complications

## 2013-11-16 NOTE — ED Notes (Signed)
Per pt sts having RLQ pain x 2 days with vomiting. [facility]

## 2013-11-16 NOTE — Progress Notes (Signed)
Pt taking Maxwell out of nose, reminded to keep in, pt asked for mask instead. Educated on why mask not used. Pt verbalized understanding, states "i just don't like things in my nose"

## 2013-11-16 NOTE — Anesthesia Procedure Notes (Signed)
Procedure Name: Intubation Date/Time: 11/16/2013 7:04 PM Performed by: Brien MatesMAHONY, Geovanna Simko D Pre-anesthesia Checklist: Patient identified, Emergency Drugs available, Suction available, Patient being monitored and Timeout performed Patient Re-evaluated:Patient Re-evaluated prior to inductionOxygen Delivery Method: Circle system utilized Preoxygenation: Pre-oxygenation with 100% oxygen Intubation Type: IV induction, Rapid sequence and Cricoid Pressure applied Laryngoscope Size: Miller and 2 Grade View: Grade I Tube type: Oral Tube size: 7.5 mm Number of attempts: 1 Airway Equipment and Method: Stylet Placement Confirmation: ETT inserted through vocal cords under direct vision,  positive ETCO2 and breath sounds checked- equal and bilateral Secured at: 22 cm Tube secured with: Tape Dental Injury: Teeth and Oropharynx as per pre-operative assessment

## 2013-11-16 NOTE — Anesthesia Preprocedure Evaluation (Signed)
Anesthesia Evaluation  Patient identified by MRN, date of birth, ID band Patient awake    Reviewed: Allergy & Precautions, H&P , NPO status , Patient's Chart, lab work & pertinent test results  History of Anesthesia Complications Negative for: history of anesthetic complications  Airway Mallampati: II  TM Distance: >3 FB Neck ROM: Full    Dental  (+) Teeth Intact, Dental Advisory Given   Pulmonary neg pulmonary ROS,  breath sounds clear to auscultation        Cardiovascular negative cardio ROS  Rhythm:Regular Rate:Normal     Neuro/Psych negative neurological ROS     GI/Hepatic Neg liver ROS, Nausea and vomiting with acute appendicitis   Endo/Other  Morbid obesity  Renal/GU negative Renal ROS     Musculoskeletal   Abdominal (+) + obese,   Peds  Hematology negative hematology ROS (+)   Anesthesia Other Findings   Reproductive/Obstetrics                             Anesthesia Physical Anesthesia Plan  ASA: I and emergent  Anesthesia Plan: General   Post-op Pain Management:    Induction: Intravenous, Rapid sequence and Cricoid pressure planned  Airway Management Planned: Oral ETT  Additional Equipment:   Intra-op Plan:   Post-operative Plan: Extubation in OR  Informed Consent: I have reviewed the patients History and Physical, chart, labs and discussed the procedure including the risks, benefits and alternatives for the proposed anesthesia with the patient or authorized representative who has indicated his/her understanding and acceptance.   Dental advisory given  Plan Discussed with: CRNA and Surgeon  Anesthesia Plan Comments: (Plan routine monitors, GETA with rapid sequence induction)        Anesthesia Quick Evaluation

## 2013-11-17 MED ORDER — OXYCODONE-ACETAMINOPHEN 5-325 MG PO TABS: 1.0000 | ORAL_TABLET | ORAL | Status: AC | PRN

## 2013-11-17 NOTE — Discharge Summary (Signed)
Physician Discharge Summary [facility]  Patient ID: Alvin Jordan MRN: [id number] DOB/AGE: 26/04/1985 26 y.o.  Admit date: 11/16/2013 Discharge date: 11/17/2013  Admission Diagnoses:  Acute appendicitis  Discharge Diagnoses:  Principal Problem:   Acute appendicitis   Discharged Condition: good  Hospital Course: patient admitted after lap appendectomy for acute appendicitis.  Post op course uncomplicated.  Ambulatory.  Tolerating regular diet.  Pain controlled with Percocet.  Prepared for discharge on POD#1.  Consults: None  Treatments: surgery: lap appendectomy  Discharge Exam: Blood pressure 103/64, pulse 53, temperature 97.8 F (36.6 C), temperature source Oral, resp. rate 15, height 5\' 1"  (1.549 m), weight 174 lb 6.4 oz (79.107 kg), SpO2 97.00%. HEENT - clear Neck - soft Chest - clear bilaterally Cor - RRR Abd - wounds dry and intact with dressings  Disposition: Home  Discharge Instructions   Diet - low sodium heart healthy    Complete by:  As directed      Discharge instructions    Complete by:  As directed   [facility]  LAPAROSCOPIC SURGERY - POST-OP INSTRUCTIONS  Always review your discharge instruction sheet given to you by the facility where your surgery was performed.  A prescription for pain medication may be given to you upon discharge.  Take your pain medication as prescribed.  If narcotic pain medicine is not needed, then you may take acetaminophen (Tylenol) or ibuprofen (Advil) as needed.  Take your usually prescribed medications unless otherwise directed.  If you need a refill on your pain medication, please contact your pharmacy.  They will contact our office to request authorization. Prescriptions will not be filled after 5 P.M. or on weekends.  You should follow a light diet the first few days after arrival home, such as soup and crackers or toast.  Be sure to include plenty of fluids daily.  Most patients  will experience some swelling and bruising in the area of the incisions.  Ice packs will help.  Swelling and bruising can take several days to resolve.   It is common to experience some constipation if taking pain medication after surgery.  Increasing fluid intake and taking a stool softener (such as Colace) will usually help or prevent this problem from occurring.  A mild laxative (Milk of Magnesia or Miralax) should be taken according to package instructions if there are no bowel movements after 48 hours.  Unless discharge instructions indicate otherwise, you may remove your bandages 24-48 hours after surgery, and you may shower at that time.  You may have steri-strips (small skin tapes) in place directly over the incision.  These strips should be left on the skin for 7-10 days.  If your surgeon used skin glue on the incision, you may shower in 24 hours.  The glue will flake off over the next 2-3 weeks.  Any sutures or staples will be removed at the office during your follow-up visit.  ACTIVITIES:  You may resume regular (light) daily activities beginning the next day-such as daily self-care, walking, climbing stairs-gradually increasing activities as tolerated.  You may have sexual intercourse when it is comfortable.  Refrain from any heavy lifting or straining until approved by your doctor.  You may drive when you are no longer taking prescription pain medication, you can comfortably wear a seatbelt, and you can safely maneuver your car and apply brakes.  You should see your doctor in the office for a follow-up appointment approximately 2-3 weeks after your surgery.  Make sure that you call for this appointment within a day or two after you arrive home to insure a convenient appointment time.  WHEN TO CALL YOUR DOCTOR: Fever over 101.0 Inability to urinate Continued bleeding from incision Increased pain, redness, or drainage from the incision Increasing abdominal pain  The clinic staff is  available to answer your questions during regular business hours.  Please don't hesitate to call and ask to speak to one of the nurses for clinical concerns.  If you have a medical emergency, go to the nearest emergency room or call 911.  [facility] is always on call for the hospital.  Velora Hecklerodd M. Audreena Sachdeva, MD, [facility] Office: [phone] Toll Free:  [phone] FAX [phone]  Web site: [email or web address]     Increase activity slowly    Complete by:  As directed      Remove dressing in 24 hours    Complete by:  As directed             Medication List         oxyCODONE-acetaminophen 5-325 MG per tablet  Commonly known as:  PERCOCET/ROXICET  Take 1-2 tablets by mouth every 4 (four) hours as needed for moderate pain.     polyethylene glycol packet  Commonly known as:  MIRALAX / GLYCOLAX  Take 17 g by mouth once.           Follow-up Information   Follow up with [facility],MD, MD In 2 weeks. (For wound re-check)    Specialty:  General Surgery      Velora Hecklerodd M. Tej Murdaugh, MD, [facility] Office: [phone]   Signed: Velora HecklerGERKIN,Lonzo Saulter M 11/17/2013, 8:52 AM

## 2013-11-19 ENCOUNTER — Encounter (HOSPITAL_COMMUNITY): Payer: Self-pay | Admitting: Surgery

## 2014-11-02 ENCOUNTER — Encounter (HOSPITAL_COMMUNITY): Payer: Self-pay | Admitting: Vascular Surgery

## 2014-11-02 ENCOUNTER — Emergency Department (HOSPITAL_COMMUNITY)
Admission: EM | Admit: 2014-11-02 | Discharge: 2014-11-02 | Disposition: A | Discharge status: Home / Self Care | Attending: Emergency Medicine | Admitting: Emergency Medicine

## 2014-11-02 ENCOUNTER — Emergency Department (HOSPITAL_COMMUNITY)

## 2014-11-02 DIAGNOSIS — R52 Pain, unspecified: Secondary | ICD-10-CM

## 2014-11-02 DIAGNOSIS — M79674 Pain in right toe(s): Secondary | ICD-10-CM | POA: Insufficient documentation

## 2014-11-02 MED ORDER — HYDROCODONE-ACETAMINOPHEN 5-325 MG PO TABS
1.0000 | ORAL_TABLET | Freq: Once | ORAL | Status: AC
  Administered 2014-11-02: 1 via ORAL
  Filled 2014-11-02: qty 1

## 2014-11-02 NOTE — ED Provider Notes (Signed)
CSN: [id number]     Arrival date & time 11/02/14  1820 History   First MD Initiated Contact with Patient 11/02/14 1851     Chief Complaint  Patient presents with  . Toe Injury     (Consider location/radiation/quality/duration/timing/severity/associated sxs/prior Treatment) HPI Comments: Patient presents to the emergency department with chief complaint of right fourth toe pain. 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 He states that after he got home he noticed that his fourth toe was hurting. He denies any known injury. It is aggravated with palpation and movement. He has not taken anything for his symptoms. The pain does not radiate. He denies any other injuries.  The history is provided by the patient. No language interpreter was used.    History reviewed. No pertinent past medical history. Past Surgical History  Procedure Laterality Date  . Laparoscopic appendectomy N/A 11/16/2013    Procedure: APPENDECTOMY LAPAROSCOPIC;  Surgeon: Manus Rudd, MD;  Location: [facility] OR;  Service: General;  Laterality: N/A;   No family history on file. Social History  Substance Use Topics  . Smoking status: Never Smoker   . Smokeless tobacco: Current User    Types: Chew  . Alcohol Use: Yes     Comment: occasional    Review of Systems  Constitutional: Negative for fever and chills.  Respiratory: Negative for shortness of breath.   Cardiovascular: Negative for chest pain.  Gastrointestinal: Negative for nausea, vomiting, diarrhea and constipation.  Genitourinary: Negative for dysuria.  Musculoskeletal: Positive for arthralgias.  All other systems reviewed and are negative.     Allergies  Review of patient's allergies indicates no known allergies.  Home Medications   Prior to Admission medications   Medication Sig Start Date End Date Taking? Authorizing Provider  oxyCODONE-acetaminophen (PERCOCET/ROXICET) 5-325 MG per tablet Take 1-2 tablets by  mouth every 4 (four) hours as needed for moderate pain. 11/17/13   Darnell Level, MD  polyethylene glycol [facility] / Ethelene Hal) packet Take 17 g by mouth once.    Historical Provider, MD   BP 115/74 mmHg  Pulse 80  Temp(Src) 98.8 F (37.1 C) (Oral)  Resp 16  SpO2 98% Physical Exam  Constitutional: He is oriented to person, place, and time. He appears well-developed and well-nourished.  HENT:  Head: Normocephalic and atraumatic.  Eyes: Conjunctivae and EOM are normal.  Neck: Normal range of motion.  Cardiovascular: Normal rate and intact distal pulses.   Pulmonary/Chest: Effort normal.  Abdominal: He exhibits no distension.  Musculoskeletal: Normal range of motion.  Right fourth toe tender to palpation, no bony abnormality or deformity, range of motion and strength limited secondary to pain  Neurological: He is alert and oriented to person, place, and time.  Skin: Skin is dry.  No evidence of wound  Psychiatric: He has a normal mood and affect. His behavior is normal. Judgment and thought content normal.  Nursing note and vitals reviewed.   ED Course  Procedures (including critical care time)  Imaging Review Dg Toe 4th Right  11/02/2014  CLINICAL DATA:  Pain, fourth toe injury EXAM: RIGHT FOURTH TOE COMPARISON:  None. FINDINGS: Three views of the right fourth toe submitted. No acute fracture or subluxation. No radiopaque foreign body. IMPRESSION: Negative. Electronically Signed   By: Natasha Mead M.D.   On: 11/02/2014 20:14   I have personally reviewed and evaluated these images and lab results as part of my medical decision-making.    MDM   Final diagnoses:  Pain  Pain of toe of right foot    Patient with right fourth toe pain. Plain films are negative. No evidence of infection. Suspect strain/sprain. We'll treat conservatively with buddy tape and postop shoe.    Alvin HorsemanRobert Stevens Magwood, PA-C 11/02/14 2046  Gilda Creasehristopher J Pollina, MD 11/02/14 2330

## 2014-11-02 NOTE — ED Notes (Signed)
To x-ray

## 2014-11-02 NOTE — ED Notes (Signed)
Pt reports to the ED for eval of right 4th toe injury. Was doing a controlled burn at work and when he came out he noticed the pain. It is currently wrapped up but significant other reports it is bruised and swollen. Pt A&Ox4, resp e/u, and skin warm and dry. Not diabetic.

## 2014-11-02 NOTE — Discharge Instructions (Signed)
You have been diagnosed with toe pain.  The x-rays of your toe are normal.  It is recommended that you follow-up with your doctor or with the orthopedic doctor provided on your paperwork.   Please use the post op shoe and buddy tape.

## 2021-04-16 ENCOUNTER — Emergency Department (HOSPITAL_COMMUNITY)
Admission: EM | Admit: 2021-04-16 | Discharge: 2021-04-16 | Disposition: A | Discharge status: Home / Self Care | Payer: Worker's Compensation | Attending: Emergency Medicine | Admitting: Emergency Medicine

## 2021-04-16 ENCOUNTER — Encounter (HOSPITAL_COMMUNITY): Payer: Self-pay | Admitting: Emergency Medicine

## 2021-04-16 ENCOUNTER — Emergency Department (HOSPITAL_COMMUNITY): Payer: Worker's Compensation

## 2021-04-16 DIAGNOSIS — S060X0A Concussion without loss of consciousness, initial encounter: Secondary | ICD-10-CM

## 2021-04-16 DIAGNOSIS — H538 Other visual disturbances: Secondary | ICD-10-CM | POA: Diagnosis not present

## 2021-04-16 DIAGNOSIS — W228XXA Striking against or struck by other objects, initial encounter: Secondary | ICD-10-CM | POA: Insufficient documentation

## 2021-04-16 DIAGNOSIS — S0093XA Contusion of unspecified part of head, initial encounter: Secondary | ICD-10-CM | POA: Diagnosis not present

## 2021-04-16 DIAGNOSIS — S0990XA Unspecified injury of head, initial encounter: Secondary | ICD-10-CM
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
## 2021-04-16 NOTE — Discharge Instructions (Addendum)
You came to the emergency department today to be evaluated for your head injury.  Your physical exam was reassuring.  The CT scan of your head did not show any injuries.  Based on your symptoms you may have suffered a concussion.  Please follow-up closely with your primary care provider next week for repeat evaluation. ? ?Please take Ibuprofen (Advil, motrin) and Tylenol (acetaminophen) to relieve your pain.   ? ?You may take up to 600 MG (3 pills) of normal strength ibuprofen every 8 hours as needed.   ?You make take tylenol, up to 1,000 mg (two extra strength pills) every 8 hours as needed.  ? ?It is safe to take ibuprofen and tylenol at the same time as they work differently.  ? Do not take more than 3,000 mg tylenol in a 24 hour period (not more than one dose every 8 hours.  Please check all medication labels as many medications such as pain and cold medications may contain tylenol.  Do not drink alcohol while taking these medications.  Do not take other NSAID'S while taking ibuprofen (such as aleve or naproxen).  Please take ibuprofen with food to decrease stomach upset. ? ?Get help right away if: ?You have new or worsening physical symptoms, such as: ?A severe or worsening headache. ?Weakness or numbness in any part of your body, slurred speech, vision changes, or confusion. ?Your coordination gets worse. ?Vomiting repeatedly. ?You have a seizure. ?You have unusual behavior changes. ?You lose consciousness, are sleepier than normal, or are difficult to wake up. ?
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
## 2023-01-15 IMAGING — CT CT HEAD W/O CM
3 series · 14 of 47 positions shown, 16 images · non-contrast
Comparison: CT head 09/13/2012

CLINICAL DATA: Head trauma.



[Series 2: head wo · axial · 0.47mm/px · z∈[-117,+8]mm · 8 of 30 slices shown, 10 images]
[im 3/30  brain]
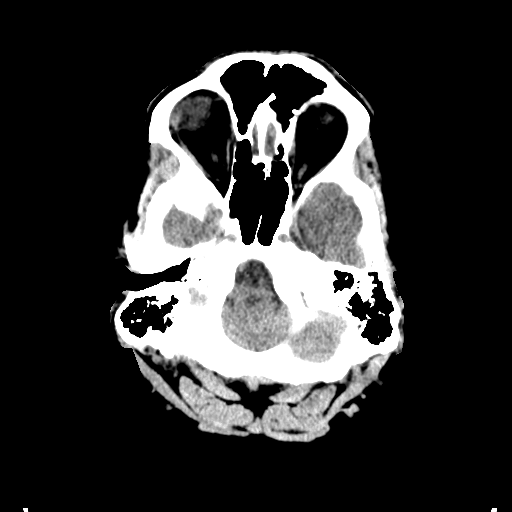
[im 3/30  bone]
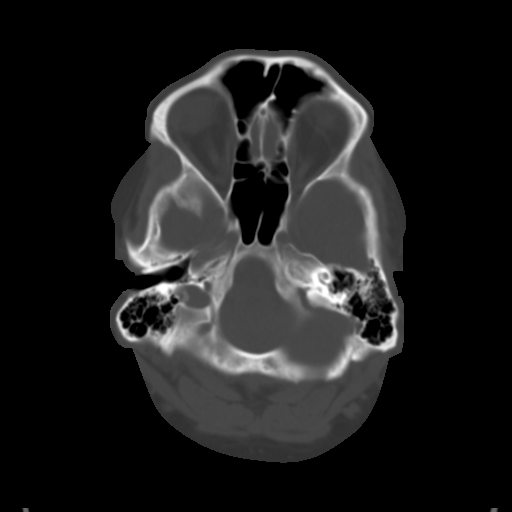
[im 7/30  brain]
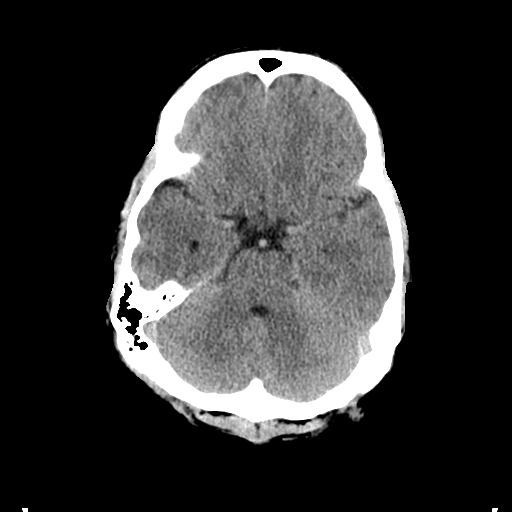
[im 10/30  brain]
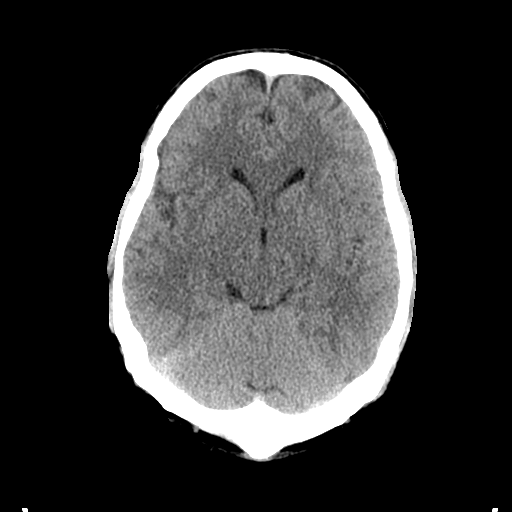
[im 14/30  brain]
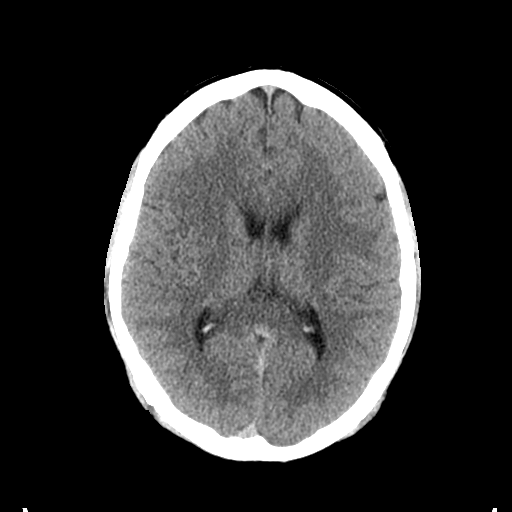
[im 17/30  brain]
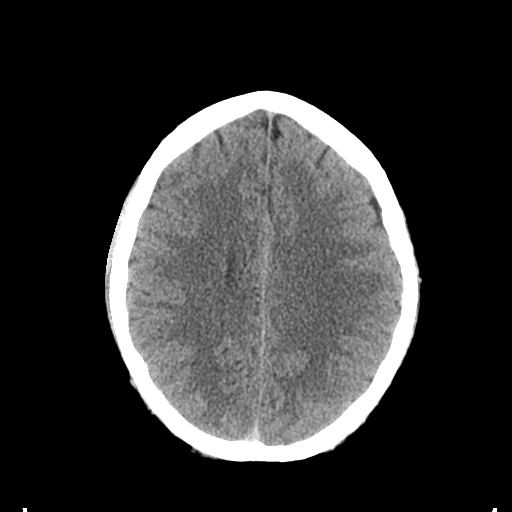
[im 17/30  bone]
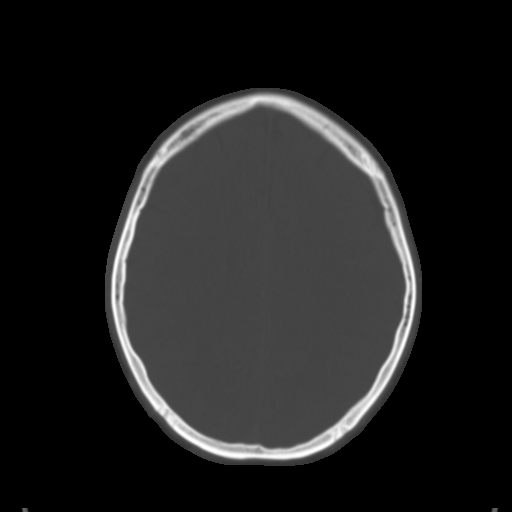
[im 21/30  brain]
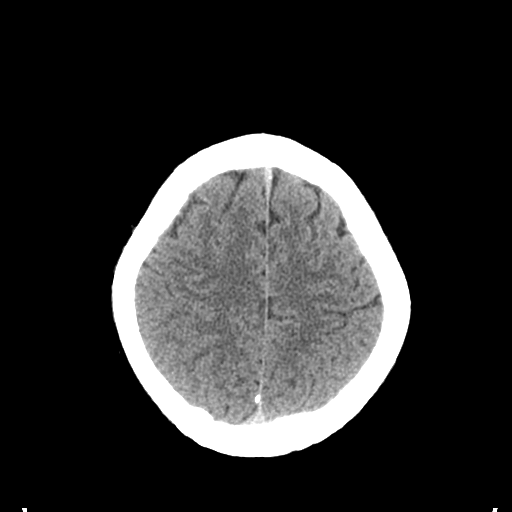
[im 24/30  brain]
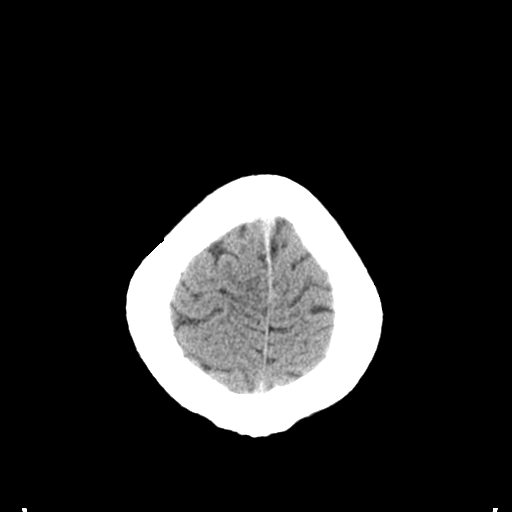
[im 28/30  brain]
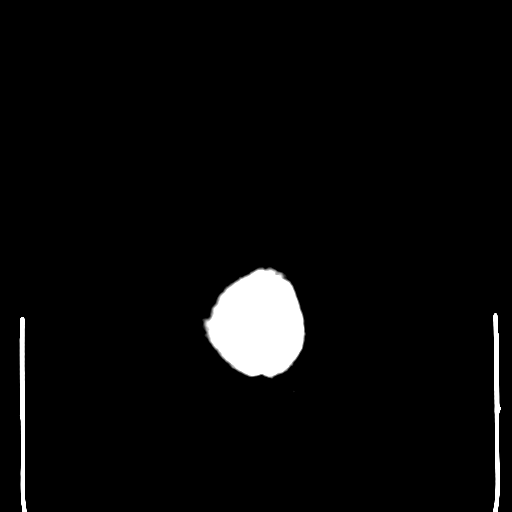

[Series 4: coronal soft tissue · coronal · 0.30mm/px · 3 of 84 slices shown]
[im 31/84  brain]
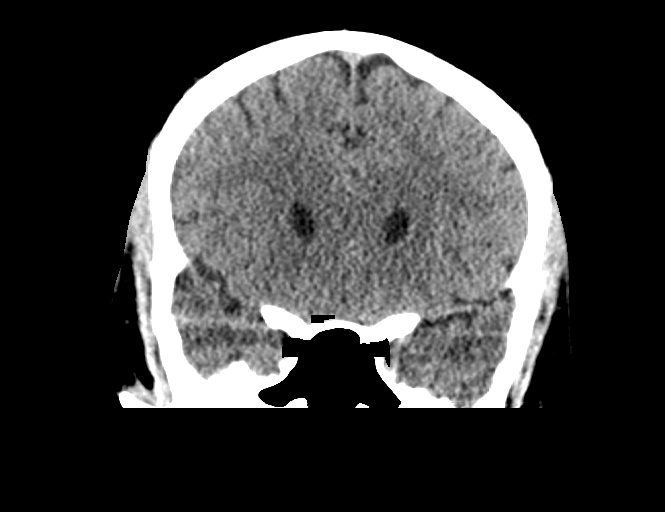
[im 38/84  brain]
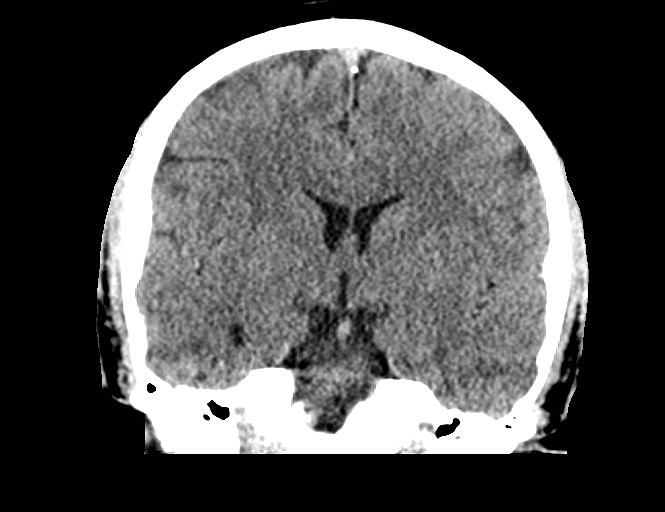
[im 46/84  brain]
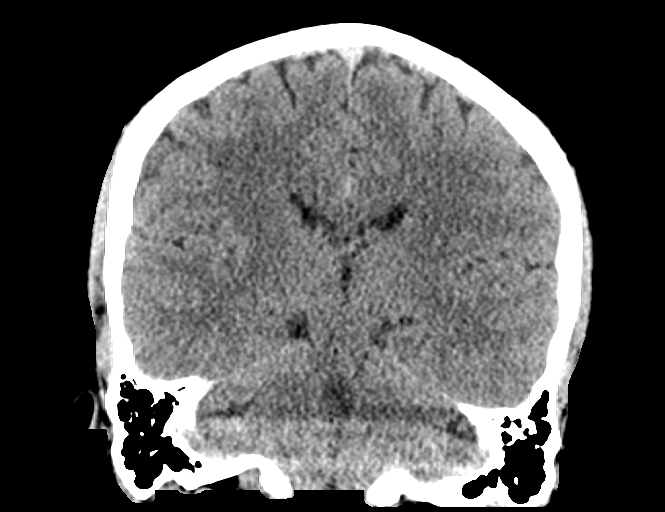

[Series 5: sagittal soft tissue · sagittal · 0.30mm/px · 3 of 66 slices shown]
[im 22/66  brain]
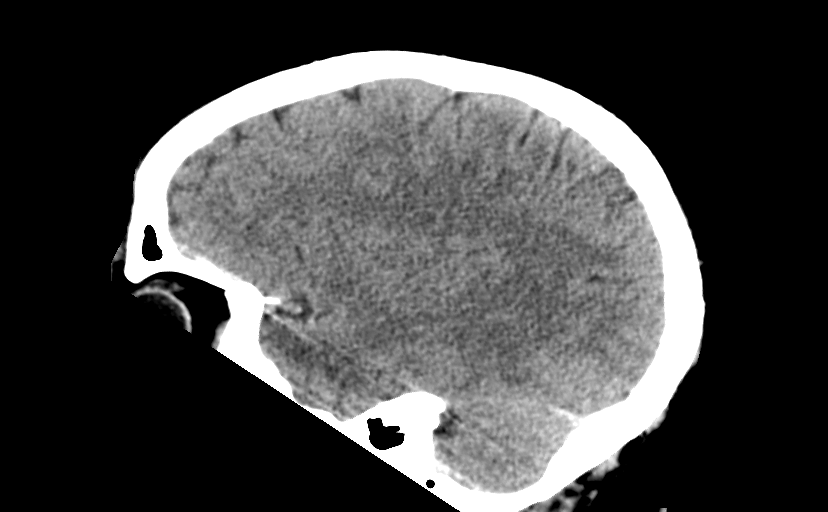
[im 33/66  brain]
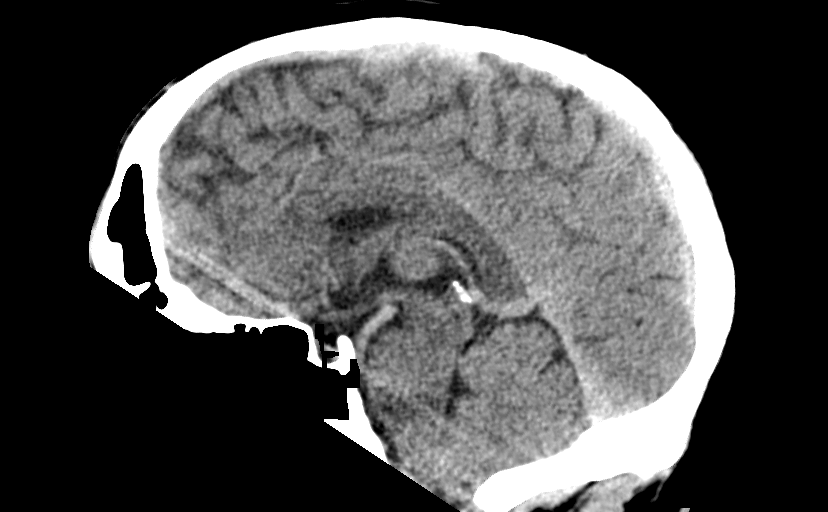
[im 44/66  brain]
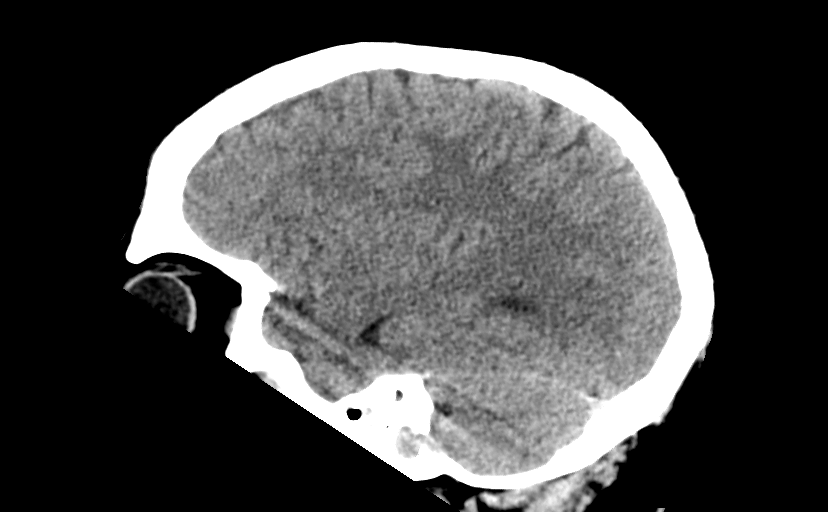

[14 of 47 positions shown; findings below may reference images not displayed]

FINDINGS: Brain: No evidence of acute infarction, hemorrhage, hydrocephalus,
extra-axial collection or mass lesion/mass effect.

Vascular: Negative for hyperdense vessel

Skull: Negative

Sinuses/Orbits: Negative

Other: None
IMPRESSION: Negative CT head
